# Patient Record
Sex: Female | Born: 1955 | Race: White | Hispanic: No | Marital: Married | Smoking: Former smoker
Health system: Southern US, Community
[De-identification: ages and names within clinical notes are randomized; demographics above are authoritative.]

## PROBLEM LIST (undated history)

## (undated) DIAGNOSIS — D1800 Hemangioma unspecified site: Secondary | ICD-10-CM

## (undated) DIAGNOSIS — J449 Chronic obstructive pulmonary disease, unspecified: Secondary | ICD-10-CM

## (undated) HISTORY — PX: APPENDECTOMY: SHX54

## (undated) HISTORY — PX: OTHER SURGICAL HISTORY: SHX169

## (undated) HISTORY — DX: Hemangioma unspecified site: D18.00

## (undated) HISTORY — DX: Chronic obstructive pulmonary disease, unspecified: J44.9

## (undated) HISTORY — PX: HERNIA REPAIR: SHX51

---

## 2007-01-10 ENCOUNTER — Emergency Department: Payer: Self-pay | Admitting: Emergency Medicine

## 2010-02-26 ENCOUNTER — Ambulatory Visit: Payer: Self-pay | Admitting: Family Medicine

## 2015-09-08 ENCOUNTER — Other Ambulatory Visit: Payer: Self-pay | Admitting: Obstetrics & Gynecology

## 2015-09-08 DIAGNOSIS — Z1231 Encounter for screening mammogram for malignant neoplasm of breast: Secondary | ICD-10-CM

## 2016-11-30 ENCOUNTER — Other Ambulatory Visit: Payer: Self-pay | Admitting: Family Medicine

## 2016-11-30 DIAGNOSIS — Z1231 Encounter for screening mammogram for malignant neoplasm of breast: Secondary | ICD-10-CM

## 2017-12-01 ENCOUNTER — Telehealth: Payer: Self-pay | Admitting: *Deleted

## 2017-12-01 DIAGNOSIS — Z87891 Personal history of nicotine dependence: Secondary | ICD-10-CM

## 2017-12-01 DIAGNOSIS — Z122 Encounter for screening for malignant neoplasm of respiratory organs: Secondary | ICD-10-CM

## 2017-12-01 NOTE — Telephone Encounter (Signed)
Received referral for low dose lung cancer screening CT scan. Message left at phone number listed in EMR for patient to call me back to facilitate scheduling scan.  

## 2017-12-01 NOTE — Telephone Encounter (Signed)
Received referral for initial lung cancer screening scan. Contacted patient and obtained smoking history,(former, quit 04/2014, 41 pack year) as well as answering questions related to screening process. Patient denies signs of lung cancer such as weight loss or hemoptysis. Patient denies comorbidity that would prevent curative treatment if lung cancer were found. Patient is scheduled for shared decision making visit and CT scan on 12/21/17.

## 2017-12-04 ENCOUNTER — Other Ambulatory Visit: Payer: Self-pay | Admitting: Registered Nurse

## 2017-12-04 DIAGNOSIS — Z1231 Encounter for screening mammogram for malignant neoplasm of breast: Secondary | ICD-10-CM

## 2017-12-20 ENCOUNTER — Encounter: Payer: Self-pay | Admitting: Nurse Practitioner

## 2017-12-21 ENCOUNTER — Inpatient Hospital Stay: Payer: Medicare Other | Attending: Nurse Practitioner | Admitting: Nurse Practitioner

## 2017-12-21 ENCOUNTER — Ambulatory Visit
Admission: RE | Admit: 2017-12-21 | Discharge: 2017-12-21 | Disposition: A | Discharge status: Home / Self Care | Payer: PRIVATE HEALTH INSURANCE | Source: Ambulatory Visit | Attending: Nurse Practitioner | Admitting: Nurse Practitioner

## 2017-12-21 DIAGNOSIS — J439 Emphysema, unspecified: Secondary | ICD-10-CM | POA: Insufficient documentation

## 2017-12-21 DIAGNOSIS — I700 Atherosclerosis of aorta: Secondary | ICD-10-CM | POA: Diagnosis not present

## 2017-12-21 DIAGNOSIS — R911 Solitary pulmonary nodule: Secondary | ICD-10-CM | POA: Insufficient documentation

## 2017-12-21 DIAGNOSIS — Z122 Encounter for screening for malignant neoplasm of respiratory organs: Secondary | ICD-10-CM | POA: Diagnosis not present

## 2017-12-21 DIAGNOSIS — Z87891 Personal history of nicotine dependence: Secondary | ICD-10-CM | POA: Insufficient documentation

## 2017-12-21 NOTE — Progress Notes (Signed)
In accordance with CMS guidelines, patient has met eligibility criteria including age, absence of signs or symptoms of lung cancer.  Social History   Tobacco Use  . Smoking status: Former Smoker    Packs/day: 1.00    Years: 41.00    Pack years: 41.00    Types: Cigarettes    Last attempt to quit: 04/2014    Years since quitting: 3.6  Substance Use Topics  . Alcohol use: Not on file  . Drug use: Not on file      A shared decision-making session was conducted prior to the performance of CT scan. This includes one or more decision aids, includes benefits and harms of screening, follow-up diagnostic testing, over-diagnosis, false positive rate, and total radiation exposure.   Counseling on the importance of adherence to annual lung cancer LDCT screening, impact of co-morbidities, and ability or willingness to undergo diagnosis and treatment is imperative for compliance of the program.   Counseling on the importance of continued smoking cessation for former smokers; the importance of smoking cessation for current smokers, and information about tobacco cessation interventions have been given to patient including [place] and [employer] quit [place] programs.   Written order for lung cancer screening with LDCT has been given to the patient and any and all questions have been answered to the best of my abilities.    Yearly follow up will be coordinated by Burgess Estelle, Thoracic Navigator.  Beckey Rutter, DNP, AGNP-C [place] at [facility] [phone] (work cell) [phone] (office) 12/21/17 3:05 PM

## 2017-12-22 ENCOUNTER — Other Ambulatory Visit: Payer: Self-pay | Admitting: *Deleted

## 2017-12-22 ENCOUNTER — Telehealth: Payer: Self-pay | Admitting: *Deleted

## 2017-12-22 DIAGNOSIS — R918 Other nonspecific abnormal finding of lung field: Secondary | ICD-10-CM

## 2017-12-22 NOTE — Telephone Encounter (Signed)
After discussion with thoracic nurse navigator and patient's primary care provider, patient notified of lung screening results with recommendation and plan for PET scan and follow up with medical oncology. Patient is in agreement with plan. Case will also be discussed in our multidisciplinary thoracic conference.   IMPRESSION: Lung-RADS 4B, suspicious. Additional imaging evaluation or consultation with Pulmonology or Thoracic Surgery recommended.  17.1 mm irregular nodule in the lateral right middle lobe. Additional 8 mm mural nodule along the inferior aspect of the cavitary lesion in the lateral right upper lobe.  Discussion at multidisciplinary tumor board is suggested. PET-CT should be considered.  These results will be called to the ordering clinician or representative by the Radiologist Assistant, and communication documented in the PACS or zVision Dashboard.  Aortic Atherosclerosis (ICD10-I70.0) and Emphysema (ICD10-J43.9).   Electronically Signed   By: Julian Hy M.D.   On: 12/22/2017 12:43

## 2017-12-22 NOTE — Telephone Encounter (Signed)
PET scheduled on 9/5 at 8:30am. Pt added to conference. Scheduled to see Dr. Tasia Catchings on Fri 9/6 in lung [place]. Will notify pt with appts on Tuesday 9/3.

## 2017-12-26 NOTE — Telephone Encounter (Signed)
Pt notified with appts.

## 2017-12-27 ENCOUNTER — Encounter: Payer: Self-pay | Admitting: Nurse Practitioner

## 2017-12-27 ENCOUNTER — Telehealth: Payer: Self-pay | Admitting: Nurse Practitioner

## 2017-12-27 NOTE — Telephone Encounter (Signed)
Pamela Serrano, is 62 year old female who was referred to lung cancer screening program for current use of tobacco products. She had a CT scan of the chest which noted multiple lung nodules that are highly suspicious for underlying malignancy.   Patient has been referred to the [facility] and will require a PET scan for further evaluation and to optimize management. PET is also needed to assist in further diagnostic studies including possible biopsy.

## 2017-12-27 NOTE — Progress Notes (Signed)
Pamela Serrano, is 62 year old female who was referred to lung cancer screening program for current use of tobacco products. She had a CT scan of the chest which noted multiple lung nodules that are highly suspicious for underlying malignancy.   Patient has been referred to the [facility] and will require a PET scan for further evaluation and to optimize management. PET is also needed to assist in further diagnostic studies including possible biopsy.

## 2017-12-28 ENCOUNTER — Inpatient Hospital Stay: Payer: Medicare Other | Attending: Oncology

## 2017-12-28 ENCOUNTER — Encounter
Admission: RE | Admit: 2017-12-28 | Discharge: 2017-12-28 | Disposition: A | Discharge status: Home / Self Care | Payer: PRIVATE HEALTH INSURANCE | Source: Ambulatory Visit | Attending: Nurse Practitioner | Admitting: Nurse Practitioner

## 2017-12-28 DIAGNOSIS — R918 Other nonspecific abnormal finding of lung field: Secondary | ICD-10-CM | POA: Insufficient documentation

## 2017-12-28 LAB — GLUCOSE, CAPILLARY: GLUCOSE-CAPILLARY: 83 mg/dL (ref 70–99)

## 2017-12-28 MED ORDER — FLUDEOXYGLUCOSE F - 18 (FDG) INJECTION
8.9000 | Freq: Once | INTRAVENOUS | Status: AC | PRN
  Administered 2017-12-28: 8.9 via INTRAVENOUS

## 2017-12-29 ENCOUNTER — Inpatient Hospital Stay (HOSPITAL_BASED_OUTPATIENT_CLINIC_OR_DEPARTMENT_OTHER): Payer: Medicare Other | Admitting: Oncology

## 2017-12-29 ENCOUNTER — Encounter: Payer: Self-pay | Admitting: Oncology

## 2017-12-29 ENCOUNTER — Other Ambulatory Visit: Payer: Self-pay

## 2017-12-29 ENCOUNTER — Encounter: Payer: Self-pay | Admitting: *Deleted

## 2017-12-29 VITALS — BP 132/87 | HR 80 | Temp 97.8°F | Resp 18 | Ht 62.5 in | Wt 158.1 lb

## 2017-12-29 DIAGNOSIS — Z87891 Personal history of nicotine dependence: Secondary | ICD-10-CM | POA: Diagnosis not present

## 2017-12-29 DIAGNOSIS — Z808 Family history of malignant neoplasm of other organs or systems: Secondary | ICD-10-CM

## 2017-12-29 DIAGNOSIS — R918 Other nonspecific abnormal finding of lung field: Secondary | ICD-10-CM | POA: Diagnosis not present

## 2017-12-29 DIAGNOSIS — J449 Chronic obstructive pulmonary disease, unspecified: Secondary | ICD-10-CM

## 2017-12-29 DIAGNOSIS — Z801 Family history of malignant neoplasm of trachea, bronchus and lung: Secondary | ICD-10-CM | POA: Diagnosis not present

## 2017-12-29 DIAGNOSIS — Z807 Family history of other malignant neoplasms of lymphoid, hematopoietic and related tissues: Secondary | ICD-10-CM

## 2017-12-29 DIAGNOSIS — Z8052 Family history of malignant neoplasm of bladder: Secondary | ICD-10-CM

## 2017-12-29 NOTE — Progress Notes (Signed)
  Oncology Nurse Navigator Documentation  Navigator Location: [facility]-Med Onc (12/29/17 1000) Referral date to RadOnc/MedOnc: 12/22/17 (12/29/17 1000) )Navigator Encounter Type: Initial MedOnc (12/29/17 1000)   Abnormal Finding Date: 12/22/17 (12/29/17 1000)           Multidisiplinary Clinic Date: 12/29/17 (12/29/17 1000) Multidisiplinary Clinic Type: Thoracic (12/29/17 1000)       Barriers/Navigation Needs: Coordination of Care (12/29/17 1000)   Interventions: Coordination of Care (12/29/17 1000)   Coordination of Care: Appts;Radiology (12/29/17 1000)        Acuity: Level 2 (12/29/17 1000)   Acuity Level 2: Assistance expediting appointments (12/29/17 1000)  met patient during initial med-onc consultation with Dr. Tasia Catchings. Dr. Tasia Catchings reviewed all imaging results with patient and discussed next steps to get brain MRI, PFT's, and referral to thoracic surgeon. All questions answered at the time of visit. Reviewed upcoming appts with patient. Pt informed to expect phone call from Dr. Genevive Bi office to schedule consultation. Contact info given and instructed to call with any further questions or needs. Pt verbalized understanding.   Time Spent with Patient: 60 (12/29/17 1000)

## 2017-12-29 NOTE — Progress Notes (Signed)
Hematology/Oncology Consult note [facility] Telephone:([phone] [phone]   Patient Care Team: Center, [facility] as PCP - [place], [place], [place] as Registered Nurse  REFERRING PROVIDER: Lung cancer screening clinic CHIEF COMPLAINTS/REASON FOR VISIT:  Evaluation of lung mass  HISTORY OF PRESENTING ILLNESS:  Pamela Serrano is a  62 y.o.  female with PMH listed below who was referred to me for evaluation of lung mass  Patient had a history of extensive smoking history, quitting 3 years ago.  Patient had lung cancer screening CT chest without contrast done on 12/21/2017 which showed 17.1 mm irregular nodule in the lateral right middle lobe.  There is also an additional 8 mm mural nodule along the inferior aspect of the cavity lesion in the lateral right upper lobe. 12/28/2017, Pamela Serrano underwent PET scan.  There is intense radio tracer uptake associated with a solid micro lobulated nodule in the right middle lobe with an SUV max of 13.3.  Suspicious for primary bronchogenic carcinoma. There is also mild FDG uptake study with the part solid nodule within the right upper lobe measuring 1.5 cm.   Patient denies any cough, chest pain, weight loss, fever or chills.  Pamela Serrano feels anxious about the image findings.  Pamela Serrano has shortness of breath with strenuous activity. Pamela Serrano also tells me that Pamela Serrano is in the process of getting primary care physician at [facility] practice.  Pamela Serrano may want to transfer all  her care to [facility]  Review of Systems  Constitutional: Negative for chills, fever, malaise/fatigue and weight loss.  HENT: Negative for nosebleeds and sore throat.   Eyes: Negative for double vision, photophobia and redness.  Respiratory: Negative for cough and wheezing.   Cardiovascular: Negative for chest pain, palpitations and orthopnea.  Gastrointestinal: Negative for abdominal pain, blood in stool, nausea and vomiting.    Genitourinary: Negative for dysuria.  Musculoskeletal: Negative for back pain, myalgias and neck pain.  Skin: Negative for itching and rash.  Neurological: Negative for dizziness, tingling and tremors.  Endo/Heme/Allergies: Negative for environmental allergies. Does not bruise/bleed easily.  Psychiatric/Behavioral: Negative for depression. The patient is nervous/anxious.     MEDICAL HISTORY:  Past Medical History:  Diagnosis Date  . Cavernous hemangioma   . COPD (chronic obstructive pulmonary disease) ([place])     SURGICAL HISTORY: Past Surgical History:  Procedure Laterality Date  . APPENDECTOMY    . CESAREAN SECTION    . HERNIA REPAIR    . Right wrist surgery      SOCIAL HISTORY: Social History   Socioeconomic History  . Marital status: Married    Spouse name: Not on file  . Number of children: Not on file  . Years of education: Not on file  . Highest education level: Not on file  Occupational History  . Occupation: retired  Scientific laboratory technician  . Financial resource strain: Not on file  . Food insecurity:    Worry: Not on file    Inability: Not on file  . Transportation needs:    Medical: Not on file    Non-medical: Not on file  Tobacco Use  . Smoking status: Former Smoker    Packs/day: 1.00    Years: 40.00    Pack years: 40.00    Types: Cigarettes    Last attempt to quit: 04/2014    Years since quitting: 3.6  . Smokeless tobacco: Never Used  Substance and Sexual Activity  . Alcohol use: Yes    Comment:  a couple of glasses of wine on the weekends  . Drug use: Never  . Sexual activity: Not on file  Lifestyle  . Physical activity:    Days per week: Not on file    Minutes per session: Not on file  . Stress: Not on file  Relationships  . Social connections:    Talks on phone: Not on file    Gets together: Not on file    Attends religious service: Not on file    Active member of club or organization: Not on file    Attends meetings of clubs or organizations:  Not on file    Relationship status: Not on file  . Intimate partner violence:    Fear of current or ex partner: Not on file    Emotionally abused: Not on file    Physically abused: Not on file    Forced sexual activity: Not on file  Other Topics Concern  . Not on file  Social History Narrative  . Not on file    FAMILY HISTORY: Family History  Problem Relation Age of Onset  . COPD Mother   . Cancer Father 38       Had lymphoma, lung, bladder and brain  . Uterine cancer Sister   . Alcohol abuse Brother   . Drug abuse Brother     ALLERGIES:  has No Known Allergies.  MEDICATIONS:  Current Outpatient Medications  Medication Sig Dispense Refill  . ibuprofen (ADVIL,MOTRIN) 200 MG tablet Take 200 mg by mouth every 4 (four) hours as needed (Pt takes 600-800 mg q 4 hrs prn).    Marland Kitchen PARoxetine (PAXIL) 10 MG tablet Take 10 mg by mouth daily.  2   No current facility-administered medications for this visit.      PHYSICAL EXAMINATION: ECOG PERFORMANCE STATUS: 0 - Asymptomatic Vitals:   12/29/17 0920  BP: 132/87  Pulse: 80  Resp: 18  Temp: 97.8 F (36.6 C)  SpO2: 96%   Filed Weights   12/29/17 0920  Weight: 158 lb 1.6 oz (71.7 kg)    Physical Exam  Constitutional: Pamela Serrano is oriented to person, place, and time. No distress.  HENT:  Head: Normocephalic and atraumatic.  Right Ear: External ear normal.  Left Ear: External ear normal.  Mouth/Throat: Oropharynx is clear and moist.  Eyes: Pupils are equal, round, and reactive to light. EOM are normal. No scleral icterus.  Neck: Normal range of motion. Neck supple.  Cardiovascular: Normal rate, regular rhythm and normal heart sounds.  Pulmonary/Chest: Effort normal. No respiratory distress. Pamela Serrano has no wheezes.  Decreased breath sound bilaterally.   Abdominal: Soft. Bowel sounds are normal. Pamela Serrano exhibits no distension and no mass. There is no tenderness.  Musculoskeletal: Normal range of motion. Pamela Serrano exhibits no edema or deformity.    Neurological: Pamela Serrano is alert and oriented to person, place, and time. No cranial nerve deficit. Coordination normal.  Skin: Skin is warm and dry. No rash noted. No erythema.  Psychiatric:  Anxious     LABORATORY DATA:  I have reviewed the data as listed No results found for: WBC, HGB, HCT, MCV, PLT No results for input(s): NA, K, CL, CO2, GLUCOSE, BUN, CREATININE, CALCIUM, GFRNONAA, GFRAA, PROT, ALBUMIN, AST, ALT, ALKPHOS, BILITOT, BILIDIR, IBILI in the last 8760 hours. Iron/TIBC/Ferritin/ %Sat No results found for: IRON, TIBC, FERRITIN, IRONPCTSAT      ASSESSMENT & PLAN:  1. Mass of right lung   2. Chronic obstructive pulmonary disease, unspecified COPD type ([place])  CT scan and PET scan image were independently reviewed by me and discussed with patient. Suspect primary lung cancer in 2 different ipsilateral lobes.  No hypermetabolic mediastinal or hilar lymph nodes.  No evidence of distant metastasis. Clinically [id number] Her case was discussed on multidisciplinary tumor board on 12/28/2017 and discussed with Radiology and Surgery. Consensus was to obtain pulmonary function testing and refer to thoracic surgery Dr. Genevive Bi for evaluation of excisional biopsy.  Obtain brain MRI.  If not a candidate, will obtain CT guided biopsy of lung mass.    We spent sufficient time to discuss many aspect of care, questions were answered to patient's satisfaction.  Orders Placed This Encounter  Procedures  . MR Brain W Wo Contrast    Standing Status:   Future    Standing Expiration Date:   12/29/2018    Order Specific Question:   ** REASON FOR EXAM (FREE TEXT)    Answer:   lung mass suspect cancer. staging.    Order Specific Question:   If indicated for the ordered procedure, I authorize the administration of contrast media per Radiology protocol    Answer:   Yes    Order Specific Question:   What is the patient's sedation requirement?    Answer:   No Sedation    Order Specific Question:   Does the  patient have a pacemaker or implanted devices?    Answer:   No    Order Specific Question:   Use SRS Protocol?    Answer:   No    Order Specific Question:   Radiology Contrast Protocol - do NOT remove file path    Answer:   [email or web address]    Order Specific Question:   Preferred imaging location?    Answer:   [facility] (table limit-300lbs)  . Ambulatory referral to General Surgery    Referral Priority:   Routine    Referral Type:   Surgical    Referral Reason:   Specialty Services Required    Referred to Provider:   Nestor Lewandowsky, MD    Requested Specialty:   General Surgery    Number of Visits Requested:   1  . Pulmonary Function Test [facility] Only    Standing Status:   Future    Standing Expiration Date:   12/30/2018    Order Specific Question:   Full PFT: includes the following: basic spirometry, spirometry pre & post bronchodilator, diffusion capacity (DLCO), lung volumes    Answer:   Full PFT    Order Specific Question:   ABG    Answer:   Yes    Order Specific Question:   This test can only be performed at    Answer:   [place] questions were answered. The patient knows to call the clinic with any problems questions or concerns.  Return of visit: TBD Thank you for this kind referral and the opportunity to participate in the care of this patient. A copy of today's note is routed to referring provider  Total face to face encounter time for this patient visit was 60 min. >50% of the time was  spent in counseling and coordination of care.   Earlie Server, MD, PhD Hematology Oncology [facility] Pager- [phone] 12/29/2017

## 2017-12-29 NOTE — Progress Notes (Signed)
Patient here for follow up. States having anxiety since "all of this is going on." Pt smoked one pack/day for about 40 years. She quit smoking 3 years ago.

## 2018-01-02 ENCOUNTER — Ambulatory Visit: Payer: PRIVATE HEALTH INSURANCE

## 2018-01-05 ENCOUNTER — Ambulatory Visit: Payer: PRIVATE HEALTH INSURANCE | Attending: Oncology

## 2018-01-05 DIAGNOSIS — R918 Other nonspecific abnormal finding of lung field: Secondary | ICD-10-CM | POA: Diagnosis not present

## 2018-01-05 LAB — BLOOD GAS, ARTERIAL
ACID-BASE EXCESS: 2.8 mmol/L — AB (ref 0.0–2.0)
BICARBONATE: 27.2 mmol/L (ref 20.0–28.0)
FIO2: 0.21
O2 SAT: 95.7 %
PO2 ART: 77 mmHg — AB (ref 83.0–108.0)
Patient temperature: 37
pCO2 arterial: 40 mmHg (ref 32.0–48.0)
pH, Arterial: 7.44 (ref 7.350–7.450)

## 2018-01-05 MED ORDER — ALBUTEROL SULFATE (2.5 MG/3ML) 0.083% IN NEBU
2.5000 mg | INHALATION_SOLUTION | Freq: Once | RESPIRATORY_TRACT | Status: AC
  Administered 2018-01-05: 2.5 mg via RESPIRATORY_TRACT
  Filled 2018-01-05: qty 3

## 2018-01-08 ENCOUNTER — Encounter: Payer: Self-pay | Admitting: Cardiothoracic Surgery

## 2018-01-08 ENCOUNTER — Other Ambulatory Visit: Payer: Self-pay

## 2018-01-08 ENCOUNTER — Ambulatory Visit (INDEPENDENT_AMBULATORY_CARE_PROVIDER_SITE_OTHER): Payer: Medicare Other | Admitting: Cardiothoracic Surgery

## 2018-01-08 VITALS — BP 127/82 | HR 68 | Temp 92.8°F | Resp 14 | Wt 157.4 lb

## 2018-01-08 DIAGNOSIS — R918 Other nonspecific abnormal finding of lung field: Secondary | ICD-10-CM | POA: Diagnosis not present

## 2018-01-08 NOTE — Patient Instructions (Signed)
Patient would like to see a doctor in [place]. I have given her the information to see Dr. Andrey Spearman [phone]

## 2018-01-08 NOTE — Progress Notes (Signed)
Patient ID: Pamela Serrano, female   DOB: Jan 15, 1956, 62 y.o.   MRN: [id number]  Chief Complaint  Patient presents with  . Mass     mass of right lung, Lung nodules referred by cancer center    Referred By Dr. Tasia Catchings Reason for Referral right upper and right middle lobe mass  HPI Location, Quality, Duration, Severity, Timing, Context, Modifying Factors, Associated Signs and Symptoms.  SHOLONDA Serrano is a 62 y.o. female.  Her problems actually began several months ago when she began experiencing right shoulder and interscapular pain.  As part of her [place] healthcare insurance product she was seen by a home health nurse.  Patient described her symptoms to the home health nurse and because she is an ex-smoker a CT scan was performed.  The CT scan showed multiple bilateral pulmonary nodules measuring a few millimeters in size but 2 dominant lesions one in the upper lobe and one in the middle lobe.  These were 1 to 2 cm in size and a subsequent PET scan showed these both to be hypermetabolic.  The patient then underwent pulmonary function testing which revealed an FEV1 in the low 40s and a DLCO of approximately 60.  She smoked for many years at least 1 to 2 packs of cigarettes per day.  She quit 3 years ago.  She worked in the Set designer business.  She has no known asbestos exposure.  There is a family history of cancer in her father but no history of lung cancer.  She states that she does not get short of breath.  She is able to walk up a flight of stairs.  However she does complain of shortness of breath with exertion and other activities.  She states that her caregivers will be close to [facility] and she would like to have her care delivered there.   Past Medical History:  Diagnosis Date  . Cavernous hemangioma   . COPD (chronic obstructive pulmonary disease) ([place])     Past Surgical History:  Procedure Laterality Date  . APPENDECTOMY    . CESAREAN SECTION    . HERNIA REPAIR    . Right wrist surgery       Family History  Problem Relation Age of Onset  . COPD Mother   . Cancer Father 68       Had lymphoma, lung, bladder and brain  . Uterine cancer Sister   . Alcohol abuse Brother   . Drug abuse Brother     Social History Social History   Tobacco Use  . Smoking status: Former Smoker    Packs/day: 1.00    Years: 40.00    Pack years: 40.00    Types: Cigarettes    Last attempt to quit: 04/2014    Years since quitting: 3.7  . Smokeless tobacco: Never Used  Substance Use Topics  . Alcohol use: Yes    Comment: a couple of glasses of wine on the weekends  . Drug use: Never    No Known Allergies  Current Outpatient Medications  Medication Sig Dispense Refill  . escitalopram (LEXAPRO) 10 MG tablet TAKE 1 2 (ONE HALF) TABLET BY MOUTH ONCE DAILY FOR ONE WEEK. THEN INCREASE TO ONE TABLET DAILY FOR ONE WEEK. THEN INCREASE TO 2 TABLETS DAIL  0  . ibuprofen (ADVIL,MOTRIN) 200 MG tablet Take 200 mg by mouth every 4 (four) hours as needed (Pt takes 600-800 mg q 4 hrs prn).    . traZODone (DESYREL) 50 MG tablet TAKE  1 2 TO 1 (ONE HALF TO ONE) TABLET BY MOUTH NIGHTLY AT BEDTIME  0  . PARoxetine (PAXIL) 10 MG tablet Take 10 mg by mouth daily.  2   No current facility-administered medications for this visit.       Review of Systems A complete review of systems was asked and was negative except for the following positive findings hemorrhoids, joint pain.  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  The details of this are unknown.  Blood pressure 127/82, pulse 68, temperature (!) 92.8 F (33.8 C), temperature source Temporal, resp. rate 14, weight 157 lb 6.4 oz (71.4 kg), SpO2 97 %.  Physical Exam CONSTITUTIONAL:  Pleasant, well-developed, well-nourished, and in no acute distress. EYES: Pupils equal and reactive to light, Sclera non-icteric EARS, NOSE, MOUTH AND THROAT:  The oropharynx was clear.  Dentition is good  repair.  Oral mucosa pink and moist. LYMPH NODES:  Lymph nodes in the neck and axillae were normal RESPIRATORY:  Lungs were clear.  Normal respiratory effort without pathologic use of accessory muscles of respiration CARDIOVASCULAR: Heart was regular without murmurs.  There were no carotid bruits. GI: The abdomen was soft, nontender, and nondistended. There were no palpable masses. There was no hepatosplenomegaly. There were normal bowel sounds in all quadrants. GU:  Rectal deferred.   MUSCULOSKELETAL:  Normal muscle strength and tone.  There is clubbing of the digits but no cyanosis.   SKIN:  There were no pathologic skin lesions.  There were no nodules on palpation. NEUROLOGIC:  Sensation is normal.  Cranial nerves are grossly intact. PSYCH:  Oriented to person, place and time.  Mood and affect are normal.  Data Reviewed CT scan, PET scan and pulmonary function tests  I have personally reviewed the patient's imaging, laboratory findings and medical records.    Assessment    I have independently reviewed the patient's CT scan and PET scan.  There are 2 lesions present in the upper and middle lobes that are suspicious for lung cancer.    Plan    I did review with the patient the results of her pulmonary function test.  Patient states that there was some concern about the accuracy of these tests.  What I am able to interpret from them is that her overall FEV1 and DLCO were less than 45 and 60% respectively.  I told her that I thought that the lesions were most likely malignant and that I would recommend a biopsy as there is no prior scans for comparison.  She would like to be referred to [facility] which is where all of her care will be delivered.  We will refer her to a pulmonologist at [facility] at the patient's request.       Nestor Lewandowsky, MD 01/08/2018, 10:38 AM

## 2018-01-12 ENCOUNTER — Other Ambulatory Visit: Payer: Self-pay | Admitting: Oncology

## 2018-01-12 ENCOUNTER — Ambulatory Visit
Admission: RE | Admit: 2018-01-12 | Discharge: 2018-01-12 | Disposition: A | Discharge status: Home / Self Care | Payer: PRIVATE HEALTH INSURANCE | Source: Ambulatory Visit | Attending: Oncology | Admitting: Oncology

## 2018-01-12 DIAGNOSIS — Q048 Other specified congenital malformations of brain: Secondary | ICD-10-CM | POA: Diagnosis not present

## 2018-01-12 DIAGNOSIS — R918 Other nonspecific abnormal finding of lung field: Secondary | ICD-10-CM | POA: Diagnosis present

## 2020-07-13 IMAGING — MR MR HEAD W/O CM
11 of 13 series · 35 of 48 positions shown · non-contrast
Comparison: PET-CT 12/28/2017:

CONTRAST:  None, no suitable IV access could be obtained.

CLINICAL DATA: 62-year-old female recently diagnosed with lung
cancer. Study for staging. However, despite multiple attempts no
suitable IV access could be obtained. No IV contrast was
administered.

EXAM:
MRI HEAD WITHOUT CONTRAST
TECHNIQUE: Multiplanar, multiecho pulse sequences of the brain and surrounding
structures were obtained without intravenous contrast.

[Series 5: ax dwi_tracew · axial · 3.0mm · 0.73mm/px · z∈[-41,+121]mm · 3 of 55 slices shown (1 of 2)]
[im 1/55]
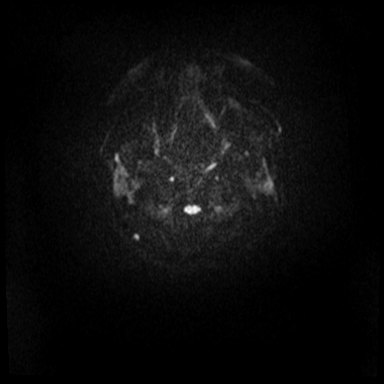
[im 28/55]
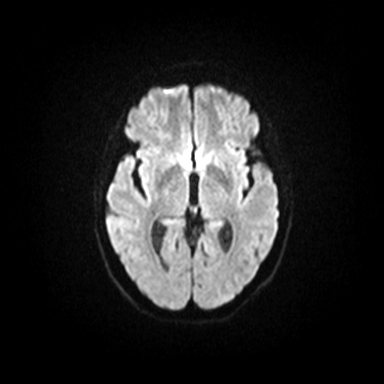
[im 55/55]
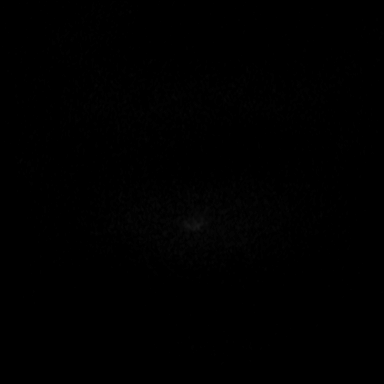

[Series 5: ax dwi_tracew · axial · 3.0mm · 0.73mm/px · z∈[-41,+121]mm · 3 of 55 slices shown (2 of 2)]
[im 1/55]
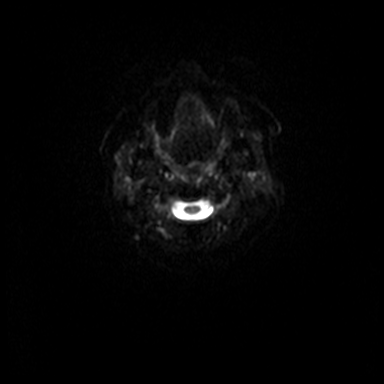
[im 28/55]
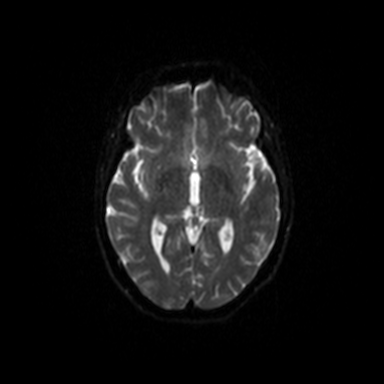
[im 55/55]
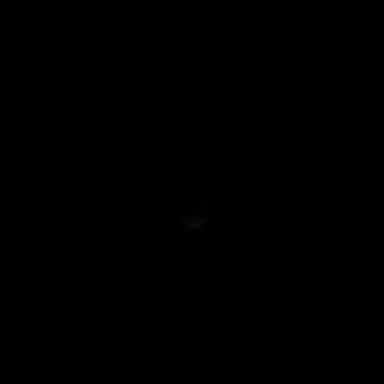

[Series 6: ax dwi_adc · axial · 3.0mm · 0.73mm/px · z∈[-41,+121]mm · 4 of 55 slices shown]
[im 1/55]
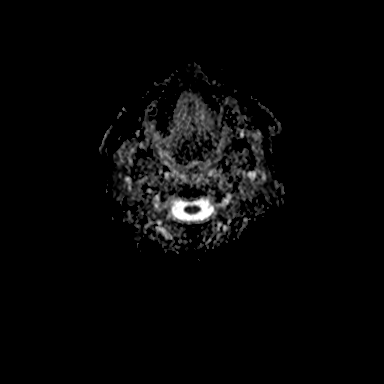
[im 19/55]
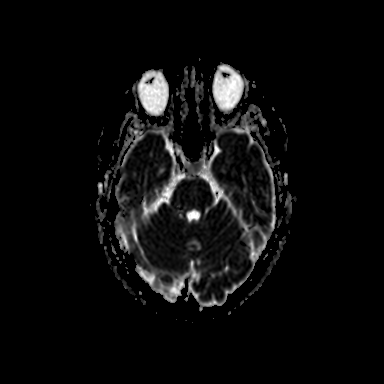
[im 37/55]
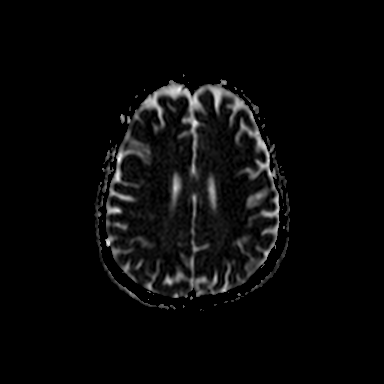
[im 55/55]
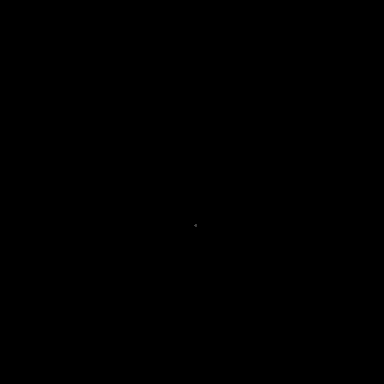

[Series 7: cor dwi_tracew · coronal · 5.0mm · 0.60mm/px · 3 of 39 slices shown (1 of 2)]
[im 1/39]
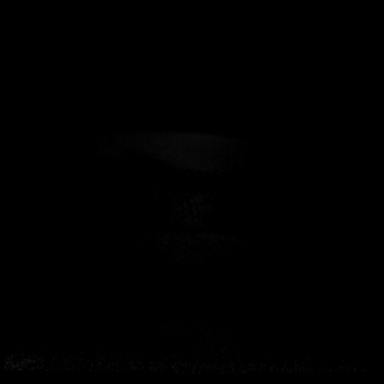
[im 20/39]
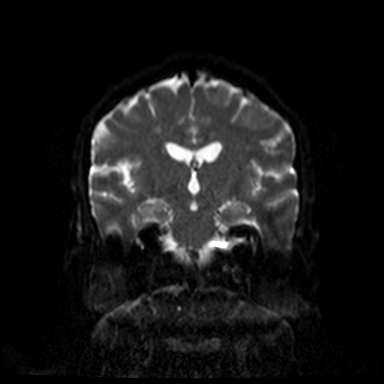
[im 39/39]
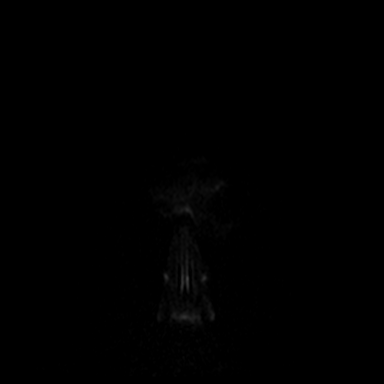

[Series 7: cor dwi_tracew · coronal · 5.0mm · 0.60mm/px · 3 of 39 slices shown (2 of 2)]
[im 1/39]
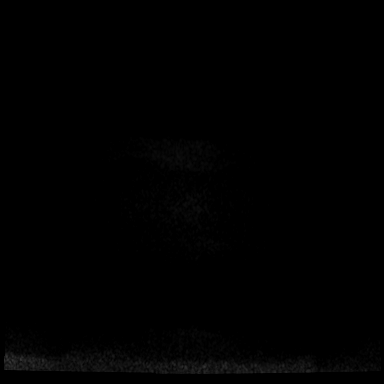
[im 20/39]
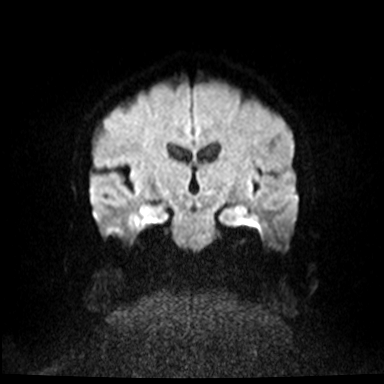
[im 39/39]
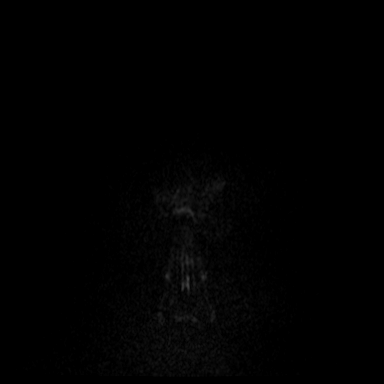

[Series 8: cor dwi_adc · coronal · 5.0mm · 0.60mm/px · 2 of 39 slices shown]
[im 1/39]
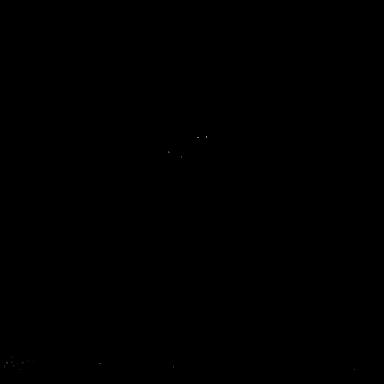
[im 20/39]
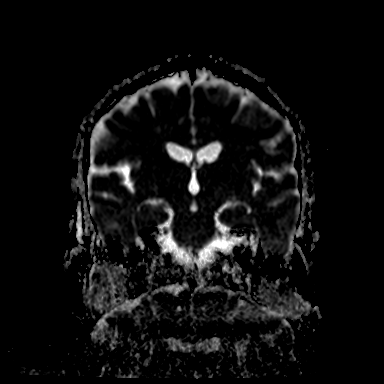

[Series 9: T1 · sagittal · 5.0mm · 0.62mm/px · 1 of 21 slices shown (1 of 2)]
[im 1/21]
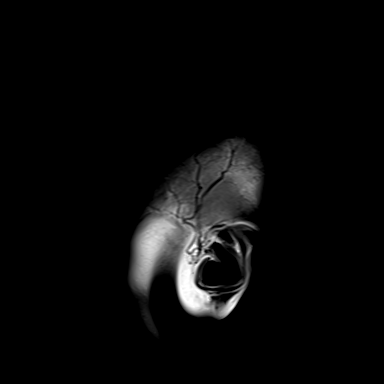

[Series 10: T2 · axial · 5.0mm · 0.53mm/px · z∈[-28,+116]mm · 2 of 25 slices shown]
[im 1/25]
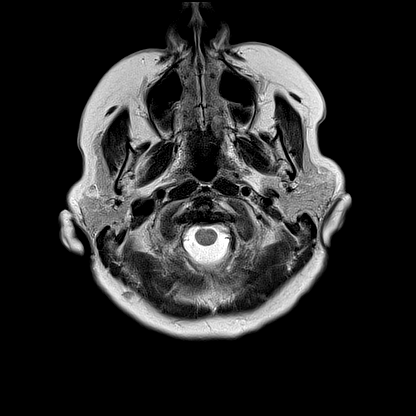
[im 25/25]
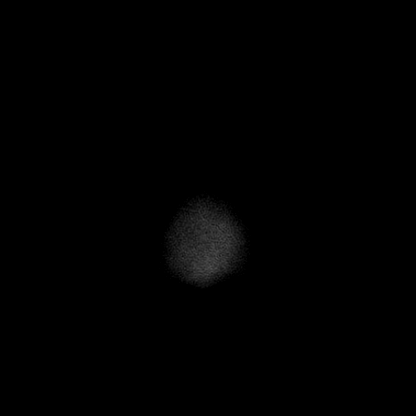

[Series 13: FLAIR · axial · 3.0mm · 0.53mm/px · z∈[-37,+125]mm · 4 of 55 slices shown]
[im 1/55]
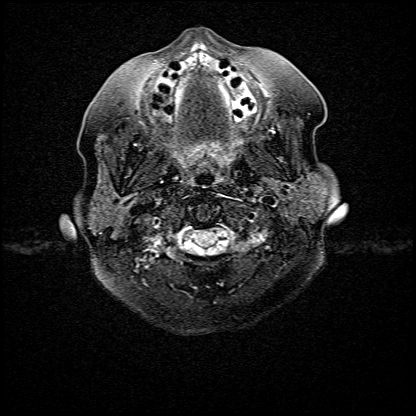
[im 19/55]
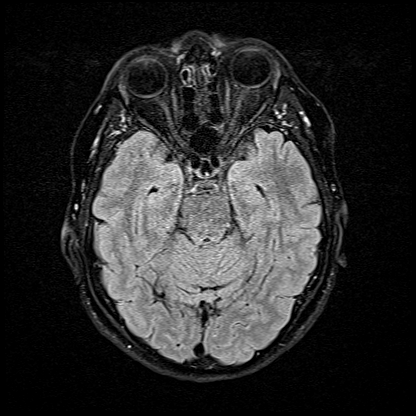
[im 37/55]
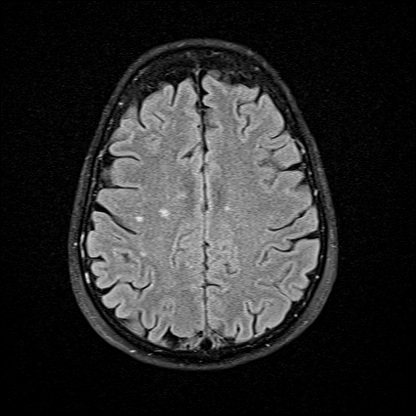
[im 55/55]
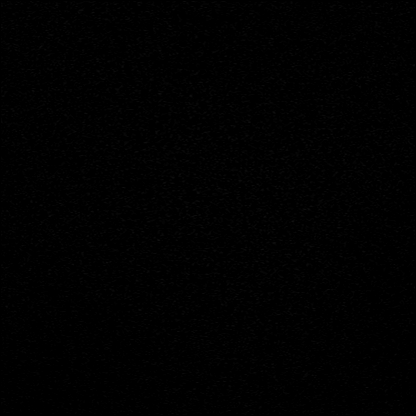

[Series 14: T1 · axial · 1.0mm · 0.98mm/px · z∈[-45,+130]mm · 8 of 171 slices shown (2 of 2)]
[im 1/171]
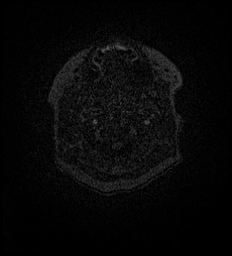
[im 31/171]
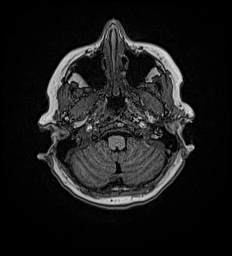
[im 47/171]
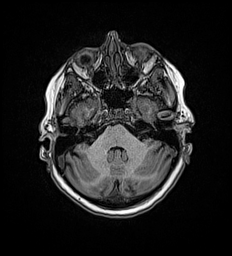
[im 78/171]
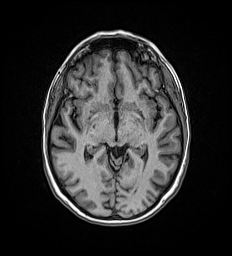
[im 93/171]
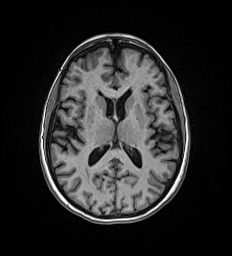
[im 124/171]
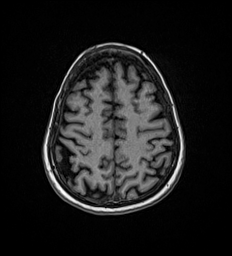
[im 140/171]
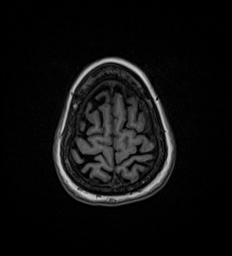
[im 171/171]
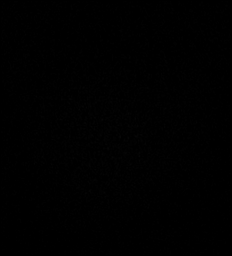

[Series 15: T2 post-contrast · coronal · 5.0mm · 0.57mm/px · 2 of 29 slices shown]
[im 1/29]
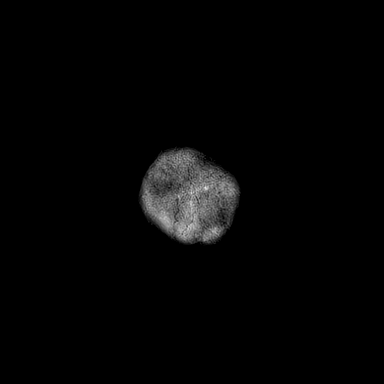
[im 29/29]
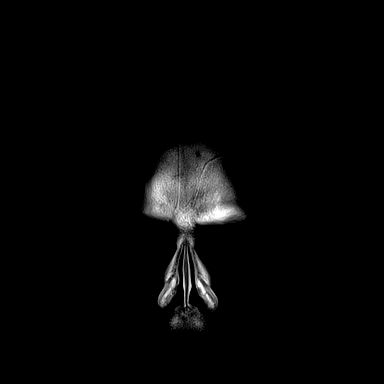

[35 of 48 positions shown; findings below may reference images not displayed]

FINDINGS: Brain: Cerebral volume is within normal limits for age. No
restricted diffusion to suggest acute infarction. No midline shift,
mass effect, evidence of mass lesion, ventriculomegaly, extra-axial
collection or acute intracranial hemorrhage. Cervicomedullary
junction and pituitary are within normal limits.

In the posterior fossa there are 2 prominent areas of chronic blood
products/hemosiderin. One of these is in the dorsal right brainstem
at the right cerebellar peduncle seen on series 2, image 7 to have
some T2 heterogeneity, and substantial susceptibility on series 11,
image 18. This lesion most resembles a cavernous vascular
malformation. There is no surrounding edema or mass effect. The 2nd
area is in the posterior left cerebellum and is a developmental
venous anomaly (DVA, normal variant).

No other chronic cerebral blood products identified. Scattered small
bilateral cerebral white matter nonspecific T2 and FLAIR
hyperintensities, mild to moderate for age. No cortical
encephalomalacia identified. Normal deep gray matter nuclei. No
abnormal signal that is specific for tumor related vasogenic edema.
No contrast was administered.

Vascular: Major intracranial vascular flow voids are preserved. The
distal left vertebral artery appears dominant.

Skull and upper cervical spine: Negative visible cervical spine.
Normal visible bone marrow signal.

Sinuses/Orbits: Normal orbits soft tissues.

Paranasal sinuses are clear.

Other: Mastoid air cells are clear. Visible internal auditory
structures appear normal. Scalp and face soft tissues appear
negative.
IMPRESSION: 1. No suitable IV access could be obtained despite multiple
attempts. No IV contrast was administered.

2. No metastatic disease identified in the absence of IV contrast.
Note that sometimes early/very small brain metastases will not have
any associated brain edema and therefore could be occult on a
noncontrast study such as this.

3. There is a prominent area of chronic blood products in the dorsal
right brainstem at the right cerebellar peduncle with signal
characteristics most compatible with a cavernous vascular
malformation (AKA "cavernous venous malformation", "cavernoma").
This is a slow flow vascular malformation, the majority of which are
asymptomatic throughout life, but can present due to repeated small
hemorrhages.
There is a superimposed developmental venous anomaly in the
posterior left cerebellum (DVA), a normal variant but which can be
associated with "cavernomas".

4. Superimposed mild to moderate for age nonspecific cerebral white
matter signal changes, most commonly due to chronic small vessel
disease.

## 2024-05-21 ENCOUNTER — Other Ambulatory Visit: Payer: Self-pay

## 2024-05-21 ENCOUNTER — Emergency Department: Admission: EM | Admit: 2024-05-21 | Discharge: 2024-05-21 | Disposition: A | Discharge status: Home / Self Care

## 2024-05-21 ENCOUNTER — Emergency Department

## 2024-05-21 DIAGNOSIS — S42291A Other displaced fracture of upper end of right humerus, initial encounter for closed fracture: Secondary | ICD-10-CM | POA: Insufficient documentation

## 2024-05-21 DIAGNOSIS — J449 Chronic obstructive pulmonary disease, unspecified: Secondary | ICD-10-CM | POA: Insufficient documentation

## 2024-05-21 DIAGNOSIS — W010XXA Fall on same level from slipping, tripping and stumbling without subsequent striking against object, initial encounter: Secondary | ICD-10-CM | POA: Diagnosis not present

## 2024-05-21 MED ORDER — KETOROLAC TROMETHAMINE 30 MG/ML IJ SOLN
15.0000 mg | Freq: Once | INTRAMUSCULAR | Status: AC
  Administered 2024-05-21: 15 mg via INTRAVENOUS
  Filled 2024-05-21: qty 1

## 2024-05-21 MED ORDER — ONDANSETRON HCL 4 MG/2ML IJ SOLN
4.0000 mg | Freq: Once | INTRAMUSCULAR | Status: AC
  Administered 2024-05-21: 4 mg via INTRAVENOUS
  Filled 2024-05-21: qty 2

## 2024-05-21 MED ORDER — MORPHINE SULFATE (PF) 4 MG/ML IV SOLN
4.0000 mg | Freq: Once | INTRAVENOUS | Status: AC
  Administered 2024-05-21: 4 mg via INTRAVENOUS
  Filled 2024-05-21: qty 1

## 2024-05-21 MED ORDER — OXYCODONE HCL 5 MG PO TABS: 5.0000 mg | ORAL_TABLET | Freq: Four times a day (QID) | ORAL | 0 refills | Status: AC | PRN

## 2024-05-21 MED ORDER — OXYCODONE HCL 5 MG PO TABS: 5.0000 mg | ORAL_TABLET | Freq: Four times a day (QID) | ORAL | 0 refills | Status: DC | PRN

## 2024-05-21 MED ORDER — NAPROXEN 500 MG PO TABS: 500.0000 mg | ORAL_TABLET | Freq: Two times a day (BID) | ORAL | 0 refills | Status: AC

## 2024-05-21 MED ORDER — NAPROXEN 500 MG PO TABS: 500.0000 mg | ORAL_TABLET | Freq: Two times a day (BID) | ORAL | 0 refills | Status: DC

## 2024-05-21 NOTE — ED Triage Notes (Signed)
 Pt slipped anf fell on ice. Pt c/o R shoulder pain.

## 2024-05-21 NOTE — Discharge Instructions (Signed)
 Please take the medication as prescribed.  If you are taking the pain medication, please be aware that it may make you dizzy or drowsy and you will be at higher risk for fall.  Do not drive or operate any machinery for at least 6 to 8 hours after your last dose.  Follow-up with the orthopedic specialist.  Call tomorrow to request an appointment.  Wear your sling at all times with the exception for brief periods when showering.  Return to the emergency department for symptoms that change, worsen, or for new concerns if you are unable to see your primary care provider or the specialist.

## 2024-05-21 NOTE — ED Provider Notes (Signed)
 "  [facility] Provider Note    None    (approximate)   History   Fall   HPI  TONILYNN BIEKER is a 69 y.o. female with history of COPD and as listed in EMR presents to the emergency department for treatment and evaluation of right upper arm and shoulder pain after mechanical, nonsyncopal fall today.  She was in her front yard and slipped.  She landed directly on the right shoulder.  She did not hit her head or lose consciousness.  She has no pain with the exception of the right shoulder and upper arm..     Physical Exam    Vitals:   05/21/24 1631  BP: (!) 111/97  Pulse: 93  Resp: 20  Temp: 98.6 F (37 C)  SpO2: 96%    General: Awake, no distress.  CV:  Good peripheral perfusion.  Resp:  Normal effort.  Abd:  No distention.  Other:  No open wounds noted to the right shoulder/right upper arm.  Demonstrates active range of motion of the fingers of the right hand.  Motor and sensory function is intact.  No pain with palpation or passive flexion and extension of the right elbow   ED Results / Procedures / Treatments   Labs (all labs ordered are listed, but only abnormal results are displayed)  Labs Reviewed - No data to display   EKG  Not indicated   RADIOLOGY  Image and radiology report reviewed and interpreted by me. Radiology report consistent with the same.  Image of the right shoulder shows a comminuted, oblique fracture of the proximal right humeral shaft with posterior lateral angulation of the distal fracture fragment  PROCEDURES:  Critical Care performed: No  Procedures   MEDICATIONS ORDERED IN ED:  Medications  morphine  (PF) 4 MG/ML injection 4 mg (4 mg Intravenous Given 05/21/24 1747)  ondansetron  (ZOFRAN ) injection 4 mg (4 mg Intravenous Given 05/21/24 1747)  ketorolac  (TORADOL ) 30 MG/ML injection 15 mg (15 mg Intravenous Given 05/21/24 1830)     IMPRESSION / MDM / ASSESSMENT AND PLAN / ED COURSE   I have reviewed  the triage note and vital signs. Vital signs stable   Differential diagnosis includes, but is not limited to, shoulder dislocation, humeral head fracture, proximal humerus fracture, humeral shaft fracture  Patient's presentation is most consistent with acute complicated illness / injury requiring diagnostic workup.  69 year old female presenting to the emergency department for treatment and evaluation after mechanical, nonsyncopal fall at home.  See HPI for further details.  On exam, diffuse tenderness over the proximal aspect of the right upper extremity.  No step-off deformity noted of the shoulder.  No open wounds.  Patient is able to demonstrate range of motion of the elbow and wrist.  X-ray is consistent with proximal humerus fracture.  She was placed in a shoulder immobilizer.  Pain control provided with morphine  and Toradol .  She was also given Zofran  to help with pain and the medication associated nausea.  Plan will have her follow-up with orthopedics and pain medication prescribed.  ER return precautions discussed as well.  Patient comfortable with plan.      FINAL CLINICAL IMPRESSION(S) / ED DIAGNOSES   Final diagnoses:  Other closed displaced fracture of proximal end of right humerus, initial encounter     Rx / DC Orders   ED Discharge Orders          Ordered    oxyCODONE  (ROXICODONE ) 5 MG immediate release tablet  Every 6 hours PRN,   Status:  Discontinued        05/21/24 1846    naproxen  (NAPROSYN ) 500 MG tablet  2 times daily with meals,   Status:  Discontinued        05/21/24 1846    naproxen  (NAPROSYN ) 500 MG tablet  2 times daily with meals        05/21/24 1857    oxyCODONE  (ROXICODONE ) 5 MG immediate release tablet  Every 6 hours PRN        05/21/24 1857             Note:  This document was prepared using Dragon voice recognition software and may include unintentional dictation errors.   Herlinda Kirk NOVAK, FNP 05/21/24 1915  "

## 2024-05-21 NOTE — ED Triage Notes (Signed)
 First nurse note: pt to ED [facility] from home for slip in front yard. Possible dislocation to right shoulder. 100mcg fentanyl, 4 zofran  PTA. Denies hitting head
